# Patient Record
Sex: Male | Born: 1995 | Race: Black or African American | Hispanic: No | Marital: Single | Smoking: Never smoker
Health system: Southern US, Community
[De-identification: ages and names within clinical notes are randomized; demographics above are authoritative.]

## PROBLEM LIST (undated history)

## (undated) DIAGNOSIS — R9431 Abnormal electrocardiogram [ECG] [EKG]: Secondary | ICD-10-CM

## (undated) DIAGNOSIS — I429 Cardiomyopathy, unspecified: Secondary | ICD-10-CM

## (undated) HISTORY — DX: Abnormal electrocardiogram (ECG) (EKG): R94.31

## (undated) HISTORY — DX: Cardiomyopathy, unspecified: I42.9

---
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
## 2017-06-29 ENCOUNTER — Ambulatory Visit (HOSPITAL_COMMUNITY)
Admission: EM | Admit: 2017-06-29 | Discharge: 2017-06-29 | Disposition: A | Discharge status: Home / Self Care | Payer: BC Managed Care – PPO | Source: Home / Self Care | Attending: Family Medicine | Admitting: Family Medicine

## 2017-06-29 ENCOUNTER — Encounter (HOSPITAL_COMMUNITY): Payer: Self-pay | Admitting: Emergency Medicine

## 2017-06-29 ENCOUNTER — Emergency Department (HOSPITAL_BASED_OUTPATIENT_CLINIC_OR_DEPARTMENT_OTHER): Payer: BC Managed Care – PPO

## 2017-06-29 ENCOUNTER — Other Ambulatory Visit: Payer: Self-pay

## 2017-06-29 ENCOUNTER — Emergency Department (HOSPITAL_COMMUNITY)
Admission: EM | Admit: 2017-06-29 | Discharge: 2017-06-29 | Disposition: A | Discharge status: Home / Self Care | Payer: BC Managed Care – PPO | Attending: Emergency Medicine | Admitting: Emergency Medicine

## 2017-06-29 ENCOUNTER — Emergency Department (HOSPITAL_COMMUNITY): Payer: BC Managed Care – PPO

## 2017-06-29 DIAGNOSIS — I300 Acute nonspecific idiopathic pericarditis: Secondary | ICD-10-CM

## 2017-06-29 DIAGNOSIS — R079 Chest pain, unspecified: Secondary | ICD-10-CM

## 2017-06-29 DIAGNOSIS — K219 Gastro-esophageal reflux disease without esophagitis: Secondary | ICD-10-CM

## 2017-06-29 DIAGNOSIS — Z79899 Other long term (current) drug therapy: Secondary | ICD-10-CM | POA: Insufficient documentation

## 2017-06-29 DIAGNOSIS — R9431 Abnormal electrocardiogram [ECG] [EKG]: Secondary | ICD-10-CM | POA: Insufficient documentation

## 2017-06-29 LAB — I-STAT TROPONIN, ED
TROPONIN I, POC: 0 ng/mL (ref 0.00–0.08)
Troponin i, poc: 0.01 ng/mL (ref 0.00–0.08)
Troponin i, poc: 0.01 ng/mL (ref 0.00–0.08)

## 2017-06-29 LAB — CBC
HCT: 44.7 % (ref 39.0–52.0)
Hemoglobin: 13.9 g/dL (ref 13.0–17.0)
MCH: 22.2 pg — ABNORMAL LOW (ref 26.0–34.0)
MCHC: 31.1 g/dL (ref 30.0–36.0)
MCV: 71.4 fL — ABNORMAL LOW (ref 78.0–100.0)
Platelets: 167 10*3/uL (ref 150–400)
RBC: 6.26 MIL/uL — ABNORMAL HIGH (ref 4.22–5.81)
RDW: 17.8 % — ABNORMAL HIGH (ref 11.5–15.5)
WBC: 4.1 10*3/uL (ref 4.0–10.5)

## 2017-06-29 LAB — ECHOCARDIOGRAM LIMITED
Height: 75 in
Weight: 2720 oz

## 2017-06-29 LAB — BASIC METABOLIC PANEL
Anion gap: 8 (ref 5–15)
BUN: 7 mg/dL (ref 6–20)
CO2: 26 mmol/L (ref 22–32)
Calcium: 9.6 mg/dL (ref 8.9–10.3)
Chloride: 104 mmol/L (ref 101–111)
Creatinine, Ser: 1.03 mg/dL (ref 0.61–1.24)
GFR calc non Af Amer: >60 mL/min (ref >60)
Glucose, Bld: 91 mg/dL (ref 65–99)
Potassium: 3.9 mmol/L (ref 3.5–5.1)
Sodium: 138 mmol/L (ref 135–145)

## 2017-06-29 MED ORDER — COLCHICINE 0.6 MG PO TABS: 0.6000 mg | ORAL_TABLET | Freq: Two times a day (BID) | ORAL | 0 refills | Status: AC

## 2017-06-29 MED ORDER — PANTOPRAZOLE SODIUM 20 MG PO TBEC: 40.0000 mg | DELAYED_RELEASE_TABLET | Freq: Every day | ORAL | 0 refills | Status: AC

## 2017-06-29 MED ORDER — ASPIRIN 81 MG PO CHEW
324.0000 mg | CHEWABLE_TABLET | Freq: Once | ORAL | Status: AC
  Administered 2017-06-29: 324 mg via ORAL
  Filled 2017-06-29: qty 4

## 2017-06-29 MED ORDER — LISINOPRIL 2.5 MG PO TABS: 2.5000 mg | ORAL_TABLET | Freq: Every day | ORAL | 0 refills | Status: AC

## 2017-06-29 NOTE — Progress Notes (Signed)
  Echocardiogram 2D Echocardiogram has been performed.  Delcie RochENNINGTON, Chau Sawin 06/29/2017, 6:22 PM

## 2017-06-29 NOTE — ED Provider Notes (Signed)
[facility]   CSN: [id number] Arrival date & time: 06/29/17  1421   History   Chief Complaint Chief Complaint  Patient presents with  . Abnormal ECG    HPI David Jefferson is a 22 y.o. male.  The history is provided by the patient.   22 yo M with no PMHx who presents from urgent care with complaint of chest pain. Sent over after a concerning EKG with STE. States he has had mild sharp left sided chest pain almost daily for the past 6 months, but has had similar pains intermittently for more than 2 years. States pain seems to occur in relation to eating, lasts around 2 minutes at a time, resolves, and then returns. This will happen a few times a day. Not accompanied by dyspnea, but does get intermittently dyspneic when eating food at times. Not relieved by anything. Denies N/V, diarphoesis. No recent fever, cough, URI symptoms. No tobacco use, HTN, HLD. No cardiac history. No immediate family history of cardiac disease at young age.   No past medical history on file.  There are no active problems to display for this patient.   No past surgical history on file.     Home Medications    Prior to Admission medications   Medication Sig Start Date End Date Taking? Authorizing Provider  colchicine 0.6 MG tablet Take 1 tablet (0.6 mg total) by mouth 2 (two) times daily for 7 days. 06/29/17 07/06/17  Diannia Ruderucker, Tehani Mersman, MD  lansoprazole (PREVACID) 15 MG capsule Take 1 capsule (15 mg total) by mouth daily at 12 noon. Patient not taking: Reported on 06/29/2017 07/27/13   Marcellina MillinGaley, Timothy, MD  lisinopril (ZESTRIL) 2.5 MG tablet Take 1 tablet (2.5 mg total) by mouth daily. 06/29/17 07/29/17  Diannia Ruderucker, Askia Hazelip, MD  methocarbamol (ROBAXIN) 500 MG tablet Take 1 tablet (500 mg total) by mouth 2 (two) times daily. Patient not taking: Reported on 06/29/2017 10/30/14   Santiago GladLaisure, Heather, PA-C  naproxen (NAPROSYN) 500 MG tablet Take 1 tablet (500 mg total) by mouth 2  (two) times daily. Patient not taking: Reported on 06/29/2017 10/30/14   Santiago GladLaisure, Heather, PA-C  pantoprazole (PROTONIX) 20 MG tablet Take 2 tablets (40 mg total) by mouth daily. 06/29/17 07/29/17  Diannia Ruderucker, Lyrick Lagrand, MD    Family History Family History  Problem Relation Age of Onset  . Healthy Mother   . Diabetes Father     Social History Social History   Tobacco Use  . Smoking status: Never Smoker  . Smokeless tobacco: Never Used  Substance Use Topics  . Alcohol use: No  . Drug use: No     Allergies   Patient has no known allergies.   Review of Systems Review of Systems  Constitutional: Negative for chills and fever.  HENT: Negative for ear pain and sore throat.   Eyes: Negative for pain and visual disturbance.  Respiratory: Positive for shortness of breath. Negative for cough.   Cardiovascular: Positive for chest pain. Negative for palpitations.  Gastrointestinal: Negative for abdominal pain and vomiting.  Genitourinary: Negative for dysuria and hematuria.  Musculoskeletal: Negative for arthralgias and back pain.  Skin: Negative for color change and rash.  Neurological: Negative for seizures and syncope.  All other systems reviewed and are negative.    Physical Exam Updated Vital Signs BP (!) 107/55   Pulse 61   Temp 98.4 F (36.9 C) (Oral)   Resp 15   Ht 6\' 3"  (1.905 m)   Wt  77.1 kg (170 lb)   SpO2 96%   BMI 21.25 kg/m   Physical Exam  Constitutional: He is oriented to person, place, and time. He appears well-developed and well-nourished.  HENT:  Head: Normocephalic and atraumatic.  Eyes: Conjunctivae are normal.  Neck: Neck supple.  Cardiovascular: Normal rate, regular rhythm, normal heart sounds and intact distal pulses. Exam reveals no friction rub.  No murmur heard. Pulmonary/Chest: Effort normal and breath sounds normal. No stridor. No respiratory distress. He has no wheezes. He exhibits no tenderness.  Abdominal: Soft. He exhibits no distension.  There is no tenderness. There is no guarding.  Musculoskeletal: Normal range of motion. He exhibits no edema.  Neurological: He is alert and oriented to person, place, and time.  Skin: Skin is warm and dry.  Psychiatric: He has a normal mood and affect.  Nursing note and vitals reviewed.    ED Treatments / Results  Labs (all labs ordered are listed, but only abnormal results are displayed) Labs Reviewed  CBC - Abnormal; Notable for the following components:      Result Value   RBC 6.26 (*)    MCV 71.4 (*)    MCH 22.2 (*)    RDW 17.8 (*)    All other components within normal limits  BASIC METABOLIC PANEL  B. BURGDORFI ANTIBODIES  CBG MONITORING, ED  I-STAT TROPONIN, ED  I-STAT TROPONIN, ED  I-STAT TROPONIN, ED  I-STAT TROPONIN, ED    EKG EKG Interpretation  Date/Time:  Monday June 29 2017 14:54:13 EDT Ventricular Rate:  83 PR Interval:  162 QRS Duration: 92 QT Interval:  328 QTC Calculation: 385 R Axis:   88 Text Interpretation:  Normal sinus rhythm ST elevation consider inferior injury or acute infarct  - new ST elevation consider anterior injury or acute infarct  - more pronounced Abnormal ECG Confirmed by Derwood Kaplan [id number]) on 06/29/2017 3:30:56 PM   Radiology Dg Chest 2 View  Result Date: 06/29/2017 CLINICAL DATA:  Chest pain EXAM: CHEST - 2 VIEW COMPARISON:  12/19/2015 FINDINGS: The heart size and mediastinal contours are within normal limits. Both lungs are clear. The visualized skeletal structures are unremarkable. IMPRESSION: No active cardiopulmonary disease. Electronically Signed   By: Elige Ko   On: 06/29/2017 15:47    Procedures Procedures (including critical care time)  Medications Ordered in ED Medications  aspirin chewable tablet 324 mg (324 mg Oral Given 06/29/17 1618)     Initial Impression / Assessment and Plan / ED Course  I have reviewed the triage vital signs and the nursing notes.  Pertinent labs & imaging results that were  available during my care of the patient were reviewed by me and considered in my medical decision making (see chart for details).     David Jefferson is a 22 y.o. male with no significant PMHx who presents with interimttent chest pain over last couple years. Sent from urgent care with EKG with STE. Reviewed and confirmed nursing documentation for past medical history, family history, social history. VS afebrile, wnl. Exam remarkable for unremarkable. Ddx includes likely early repolarization. Given chronicity of symptoms, unlikely pericarditis. History not c/w ACS.   EKG with STE in anteriors leads c/w early repolarization, there is more pronounced STE in inferior leads when compared to EKG 11/2015. ASA given. Discussed with cardiology, Dr.Nelson, who agrees that EKG likely early repol, but recommends delta troponins and stat 2D echo. Trop x 2 negative, BMP wnl. CBC wnl. CXR neg.   2D echo with  mildly diffusely decreased LVEF estimated at 50-55%, no effusion, RV appears mildly dilated. Dr.Nelson evaluated pt in department. Recommended outpt treatment as pericarditis if 3rd trop neg, as well as protonix and low dose lisinopril, f/u in 2 wks. Trop x 3 negative. Lyme antibodies drawn, cardiology to f/u. On reexam, pt resting comfortably, pain free.   Old records reviewed. Labs reviewed by me and used in the medical decision making.  Imaging viewed and interpreted by me and used in the medical decision making (formal interpretation from radiologist). EKG reviewed by me and used in the medical decision making. D/c home in stable condition, return precautions discussed. Patient agreeable with plan for d/c home.  Final Clinical Impressions(s) / ED Diagnoses   Final diagnoses:  Abnormal EKG  Chest pain, unspecified type    ED Discharge Orders        Ordered    lisinopril (ZESTRIL) 2.5 MG tablet  Daily     06/29/17 2041    colchicine 0.6 MG tablet  2 times daily     06/29/17 2041    pantoprazole  (PROTONIX) 20 MG tablet  Daily     06/29/17 2041       Diannia Ruder, MD 06/29/17 2247    Derwood Kaplan, MD 07/01/17 [phone]

## 2017-06-29 NOTE — ED Notes (Signed)
Second IV deferred by MD.

## 2017-06-29 NOTE — Consult Note (Addendum)
Cardiology Consult:   Jefferson ID: David Jefferson; MRN: [id number]; DOB: August 30, 1995   Consult date: 06/29/2017  Primary Care Provider: Patient, No Pcp Per Primary Cardiologist: Tobias Alexander, MD  - New  Chief Complaint:  Chest pain  [name] is a 22 y.o. male with no known medical history who presented with sharp intermittent chest pain.   History of Present Illness:   David Jefferson presented with complaints of intermittent, sharp left sided chest pain that has been occurring for about David last 2 years. He denied any nausea, vomiting, back pain, jaw or arm pain. He stated that he feels it worsens if he eats greasy foods or pork, he feels his diet can affect David pain. He was seen for chest pain in 2017 and ws discharged home after GI cocktail.   David Jefferson says that he is very active and plays basketball regularly. He works at [employer].   Troponins are negative. EKG showed Normal sinus rhythm with sinus arrhythmia, LVH and diffuse ST elevation in leads II, III, AVF, V3-V6. consider early repolarization, pericarditis, or injury. CXR shows no active cardiopulmonary disease.   No past medical history on file.  No past surgical history on file.   Medications Prior to Admission: Prior to Admission medications   Medication Sig Start Date End Date Taking? Authorizing Provider  lansoprazole (PREVACID) 15 MG capsule Take 1 capsule (15 mg total) by mouth daily at 12 noon. Jefferson not taking: Reported on 06/29/2017 07/27/13   Marcellina Millin, MD  methocarbamol (ROBAXIN) 500 MG tablet Take 1 tablet (500 mg total) by mouth 2 (two) times daily. Jefferson not taking: Reported on 06/29/2017 10/30/14   Santiago Glad, PA-C  naproxen (NAPROSYN) 500 MG tablet Take 1 tablet (500 mg total) by mouth 2 (two) times daily. Jefferson not taking: Reported on 06/29/2017 10/30/14   Santiago Glad, PA-C    Allergies:   No Known Allergies  Social History:   Social History   Socioeconomic  History  . Marital status: Single    Spouse name: Not on file  . Number of children: Not on file  . Years of education: Not on file  . Highest education level: Not on file  Occupational History  . Not on file  Social Needs  . Financial resource strain: Not on file  . Food insecurity:    Worry: Not on file    Inability: Not on file  . Transportation needs:    Medical: Not on file    Non-medical: Not on file  Tobacco Use  . Smoking status: Never Smoker  . Smokeless tobacco: Never Used  Substance and Sexual Activity  . Alcohol use: No  . Drug use: No  . Sexual activity: Never  Lifestyle  . Physical activity:    Days per week: Not on file    Minutes per session: Not on file  . Stress: Not on file  Relationships  . Social connections:    Talks on phone: Not on file    Gets together: Not on file    Attends religious service: Not on file    Active member of club or organization: Not on file    Attends meetings of clubs or organizations: Not on file    Relationship status: Not on file  . Intimate partner violence:    Fear of current or ex partner: Not on file    Emotionally abused: Not on file    Physically abused: Not on file    Forced  sexual activity: Not on file  Other Topics Concern  . Not on file  Social History Narrative  . Not on file    Family History:   David Jefferson's family history includes Diabetes in his father; Healthy in his mother.  Grandfather had heart disease and received a pacemaker at older age.  ROS:  Please see David history of present illness.  All other ROS reviewed and negative.     Physical Exam/Data:   Vitals:   06/29/17 1730 06/29/17 1745 06/29/17 1800 06/29/17 1815  BP: 115/64 118/75 117/67 117/77  Pulse: 60 71 65 63  Resp: 18 18 (!) 21 (!) 24  Temp:      TempSrc:      SpO2: 100% 99% 100% 100%  Weight:      Height:        Intake/Output Summary (Last 24 hours) at 06/29/2017 1922 Last data filed at 06/29/2017 1817 Gross per 24 hour    Intake -  Output 550 ml  Net -550 ml   Filed Weights   06/29/17 1622  Weight: 170 lb (77.1 kg)   Body mass index is 21.25 kg/m.  General:  Well nourished, well developed, in no acute distress HEENT: normal Lymph: no adenopathy Neck: no JVD Endocrine:  No thryomegaly Vascular: No carotid bruits; FA pulses 2+ bilaterally without bruits  Cardiac:  normal S1, S2; RRR; no murmur  Lungs:  clear to auscultation bilaterally, no wheezing, rhonchi or rales  Abd: soft, nontender, no hepatomegaly  Ext: no edema Musculoskeletal:  No deformities, BUE and BLE strength normal and equal Skin: warm and dry  Neuro:  CNs 2-12 intact, no focal abnormalities noted Psych:  Normal affect    EKG:  David ECG that was done was personally reviewed and demonstrates Normal sinus rhythm with sinus arrhythmia, LVH and diffuse ST elevation in leads II, III, AVF, V3-V6. consider early repolarization, pericarditis, or injury.  Relevant CV Studies:  Echocardiogram 06/29/17 Study Conclusions  - Left ventricle: David cavity size was normal. Systolic function was normal. David estimated ejection fraction was in David range of 50% to 55%. Wall motion was normal; there were no regional wall motion abnormalities. Left ventricular diastolic function parameters were normal. - Aortic valve: There was no regurgitation. - Right ventricle: Systolic function was normal. - Tricuspid valve: There was mild regurgitation. - Pulmonary arteries: Systolic pressure could not be accurately estimated. - Inferior vena cava: David vessel was normal in size. - Pericardium, extracardiac: There was no pericardial effusion.  Impressions: - Mildly diffusely decreased LVEF estimated at 50-55%. No   pericardial effusion. RV appears mildy dilated.  Laboratory Data:  Chemistry Recent Labs  Lab 06/29/17 1459  NA 138  K 3.9  CL 104  CO2 26  GLUCOSE 91  BUN 7  CREATININE 1.03  CALCIUM 9.6  GFRNONAA >60  GFRAA >60  ANIONGAP 8    No  results for input(s): PROT, ALBUMIN, AST, ALT, ALKPHOS, BILITOT in David last 168 hours. Hematology Recent Labs  Lab 06/29/17 1459  WBC 4.1  RBC 6.26*  HGB 13.9  HCT 44.7  MCV 71.4*  MCH 22.2*  MCHC 31.1  RDW 17.8*  PLT 167   Cardiac EnzymesNo results for input(s): TROPONINI in David last 168 hours.  Recent Labs  Lab 06/29/17 1515 06/29/17 1826  TROPIPOC 0.01 0.00    BNPNo results for input(s): BNP, PROBNP in David last 168 hours.  DDimer No results for input(s): DDIMER in David last 168 hours.  Radiology/Studies:  Dg Chest 2 View  Result Date: 06/29/2017 CLINICAL DATA:  Chest pain EXAM: CHEST - 2 VIEW COMPARISON:  12/19/2015 FINDINGS: David heart size and mediastinal contours are within normal limits. Both lungs are clear. David visualized skeletal structures are unremarkable. IMPRESSION: No active cardiopulmonary disease. Electronically Signed   By: Elige KoHetal  Patel   On: 06/29/2017 15:47    Assessment and Plan:   Chest pain -Has pain is intermittent, sharp and has been ongoing for about 2 years.  -EKG with diffuse ST changes -troponins are negative -echocardiogram  Showed EF 50-55%, no pericardial effusion. RV mildly dilated.  -Possibly pericarditis and pt has a lot of GI issues.  -Dr. Delton SeeNelson to see David Jefferson.   For questions or updates, please contact [facility] Please consult [email or web address] for contact info under Cardiology/STEMI.   Signed, Berton BonJanine Hammond, NP  06/29/2017 7:22 PM   David Jefferson was seen, examined and discussed with Berton BonJanine Hammond, NP-C and I agree with David above.   A pleasant 22 year old male with no significant past medical history or cardiac history who has been experiencing on and off daily chest pains for David last 2 years.  David Jefferson describes pain as retrosternal pressure nonradiating, not related to exertion, not associated with any shortness of breath palpitation dizziness or syncope.  He cannot describe if it is worse during David day or at night, it  does not seem to be related to flat position deep inspiration but he states that it almost always after eating food.  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  He presented to urgent care this morning and because of significant early repolarization that is diffuse he was sent for concern of acute myocardial infarction to David ER.  He denies any recent Acute viral illness, tick bite, fever, cough, or diarrhea  On physical exam he looks very comfortable, he is currently chest pain-free, he is well-developed well-nourished oriented x3 and pleasant, he has no JVDs, regular rate and rhythm, no murmurs, his lungs are clear, his peripheral pulses are good and he has no swelling.  His troponin is negative x2.  His EKG shows diffuse concave ST elevations in David inferior lateral and anterior leads, David Jefferson had pattern of early repolarization on his prior EKGs but they were not as pronounced.  Assessment and plan:  David Jefferson presents with very atypical chest pain suspicious for GI related etiology however his EKG is highly suspicious for acute pericarditis, his clinical picture with on and off pain for last 2 years that is not pleuritic not worse when he lays down does not fit that picture.  However I have personally read his EKG and he has diffuse hypokinesis with his LVEF approximately 50%.  David plan would be to start David Jefferson on Protonix 40 mg daily, colchicine 0.6 mg p.o. twice daily to empirically treat for pericarditis, we will also start a very low-dose of lisinopril 2.5 mg daily to address his cardiomyopathy, we will arrange for outpatient follow-up in our clinic in 2 weeks.  Tobias AlexanderKatarina Efe Fazzino, MD 06/29/2017

## 2017-06-29 NOTE — ED Notes (Signed)
Patient transported to X-ray 

## 2017-06-29 NOTE — ED Triage Notes (Signed)
Pt reports chest pain for 6 months that has been constant. Pt went to urgent care and had an EKG that showed ST elevation with early repol.

## 2017-06-29 NOTE — Discharge Instructions (Signed)
Please go to ER for lab testing

## 2017-06-29 NOTE — ED Triage Notes (Signed)
Left chest pain for 6 months.  Pain intermittent, has pain weekly. Patient thinks certain foods aggravates condition.  No nausea, no vomiting.  Had episode of reflux yesterday

## 2017-06-29 NOTE — ED Provider Notes (Signed)
[facility]    CSN: [id number] Arrival date & time: 06/29/17  1201     History   Chief Complaint Chief Complaint  Patient presents with  . Chest Pain    HPI David Jefferson is a 22 y.o. male.   David Jefferson presents with complaints of intermittent left sided chest pain for the past approximately 6 months. He states today it has been coming and going. It is sharp. Denies nausea, vomiting, back pain, jaw or arm pain. He states he feels it worsens if he eats greasy foods or pork, he feels his diet can affect the pain. Yesterday he had some heartburn which he felt was different pain sensation. Lasts approximately 5 minutes at a time. States he does feels somewhat more anxious lately but without any specific trigger. Occasionally feels shortness of breath . No recent travel. No leg pain or swelling. No iv drug use. No family history of early cardiac history. His grandfather has cardiac history. Has not taken any medications for symptoms. No fevers. No weight loss. No palpitations. Was seen for CP in 2017 and was discharged home after a gi cocktail. Without contributing medical history. African [place] male, states he very active, plays basketball regularly.    ROS per HPI.      History reviewed. No pertinent past medical history.  There are no active problems to display for this patient.   History reviewed. No pertinent surgical history.     Home Medications    Prior to Admission medications   Medication Sig Start Date End Date Taking? Authorizing Provider  ibuprofen (ADVIL,MOTRIN) 200 MG tablet Take 200 mg by mouth every 6 (six) hours as needed for mild pain.    [provider]  lansoprazole (PREVACID) 15 MG capsule Take 1 capsule (15 mg total) by mouth daily at 12 noon. 07/27/13   Marcellina Millin, MD  methocarbamol (ROBAXIN) 500 MG tablet Take 1 tablet (500 mg total) by mouth 2 (two) times daily. 10/30/14   Santiago Glad, PA-C  naproxen (NAPROSYN) 500 MG  tablet Take 1 tablet (500 mg total) by mouth 2 (two) times daily. 10/30/14   Santiago Glad, PA-C    Family History Family History  Problem Relation Age of Onset  . Healthy Mother   . Diabetes Father     Social History Social History   Tobacco Use  . Smoking status: Never Smoker  . Smokeless tobacco: Never Used  Substance Use Topics  . Alcohol use: No  . Drug use: No     Allergies   Patient has no known allergies.   Review of Systems Review of Systems   Physical Exam Triage Vital Signs ED Triage Vitals  Enc Vitals Group     BP 06/29/17 1259 138/72     Pulse Rate 06/29/17 1259 (!) 56     Resp 06/29/17 1259 18     Temp 06/29/17 1259 98 F (36.7 C)     Temp Source 06/29/17 1259 Oral     SpO2 06/29/17 1259 100 %     Weight --      Height --      Head Circumference --      Peak Flow --      Pain Score 06/29/17 1256 3     Pain Loc --      Pain Edu? --      Excl. in GC? --    No data found.  Updated Vital Signs BP 138/72 (BP Location: Left Arm)   Pulse David Jefferson Kitchen)  56   Temp 98 F (36.7 C) (Oral)   Resp 18   SpO2 100%    Physical Exam  Constitutional: He is oriented to person, place, and time. He appears well-developed and well-nourished.  Eyes: Pupils are equal, round, and reactive to light.  Neck: Normal range of motion.  Cardiovascular: Regular rhythm and normal pulses. Bradycardia present.  Pulmonary/Chest: Effort normal and breath sounds normal.  Abdominal: Soft. There is no tenderness.  Neurological: He is alert and oriented to person, place, and time.  Skin: Skin is warm and dry.    EKG with stelevation noted, rate of 69.   UC Treatments / Results  Labs (all labs ordered are listed, but only abnormal results are displayed) Labs Reviewed - No data to display  EKG None  Radiology No results found.  Procedures Procedures (including critical care time)  Medications Ordered in UC Medications - No data to display  Initial Impression /  Assessment and Plan / UC Course  I have reviewed the triage vital signs and the nursing notes.  Pertinent labs & imaging results that were available during my care of the patient were reviewed by me and considered in my medical decision making (see chart for details).     Cardiology Attending paged at 1:20 pm CardMaster paged 1:43pm  1402: Spoke with Cardiology Dr. Mayford Knifeurner who does recommend enzymes to be collected for [facility] at this time although ekg does appear to be likely repolarization. Discussed with patient that we do not have these lab capabilities here in UC and recommended go to ER. Patient verbalized understanding and agreeable to plan.  Self tranpsort to ER.   Final Clinical Impressions(s) / UC Diagnoses   Final diagnoses:  Chest pain, unspecified type     Discharge Instructions     Please go to ER for lab testing     ED Prescriptions    None     Controlled Substance Prescriptions [place] Controlled Substance Registry consulted? Not Applicable   Georgetta HaberBurky, Tuwanna Krausz B, NP 06/29/17 [phone]

## 2017-06-29 NOTE — ED Provider Notes (Signed)
Patient placed in Quick Look pathway, seen and evaluated   Chief Complaint: chest pain  HPI:   Everlean AlstromMaurice presents with complaints of intermittent left sided chest pain for the past approximately 6 months. He states today it has been coming and going. It is sharp. Denies nausea, vomiting, back pain, jaw or arm pain. He states he feels it worsens if he eats greasy foods or pork, he feels his diet can affect the pain. Yesterday he had some heartburn which he felt was different pain sensation. Lasts approximately 5 minutes at a time. States he does feels somewhat more anxious lately but without any specific trigger. Occasionally feels shortness of breath . No recent travel. No leg pain or swelling. No iv drug use. No family history of early cardiac history. His grandfather has cardiac history. Has not taken any medications for symptoms. No fevers. No weight loss. No palpitations. Was seen for CP in 2017 and was discharged home after a gi cocktail. Without contributing medical history. African [place] male, states he very active, plays basketball regularly. Patient works at [employer].    ROS: Heart: chest pain  Resp: shortness of breath  Physical Exam:  BP 122/68 (BP Location: Left Arm)   Pulse 78   Temp 98.4 F (36.9 C) (Oral)   Resp 16   SpO2 100%   Gen: No distress  Neuro: Awake and Alert  Skin: Warm and dry  Heart: regular rate and rhythm  Lungs: clear     Initiation of care has begun. The patient has been counseled on the process, plan, and necessity for staying for the completion/evaluation, and the remainder of the medical screening examination     Janne Napoleoneese, Hope M, NP 06/29/17 1502    Pricilla LovelessGoldston, Scott, MD 06/30/17 1034

## 2017-06-29 NOTE — ED Notes (Signed)
Code STEMI activated via [facility] w/ [facility].

## 2017-07-14 ENCOUNTER — Encounter: Payer: Self-pay | Admitting: Physician Assistant

## 2017-07-14 NOTE — Progress Notes (Deleted)
Cardiology Office Note    Date:  07/14/2017  ID:  David Jefferson, DOB Jun 12, 1995, MRN [id number] PCP:  Patient, No Pcp Per  Cardiologist:  Tobias Alexander, MD   Chief Complaint: ***  History of Present Illness:  David Jefferson is a 22 y.o. male with history of abnormal EKG and possible cardiomyopathy who is here to follow up chest pain.  He was seen for chest pain in 2017 and ws discharged home after GI cocktail. He recently presented to Urgent Care 06/29/17 with intermittent, sharp left sided chest pain that has been occurring for about the last 2 years. He denied any nausea, vomiting, back pain, jaw or arm pain. He stated that he felt it would if he ate greasy foods or pork. EKG was abnormal so he was sent to the ER for rule-out. This showed normal sinus rhythm with sinus arrhythmia, LVH and diffuse ST elevation in leads II, III, AVF, V3-V6. consider early repolarization, pericarditis, or injury. Troponins were negative. He is very active and plays basketball regularly. He works at [employer]. CXR nonacute. Echo 06/29/17 showed EF 50-55%, no RWMA per report (diffuse HK by Dr. Lindaann Slough read), RV appeared mildly dilated, mild TR. Dr. Delton See recommended Protonix 40mg  daily, colchicine 0.6mg  BID, and lisinopril 2.5mg  daily to address low normal LVEF. Labs otherwise showed Hgb 13.9 with decreased MCV and normal BMET.   bmet colchicine  Atypical chest pain Cardiomyopathy Abnormal EKG   Past Medical History:  Diagnosis Date  . Abnormal EKG   . Cardiomyopathy (HCC)    a. low normal EF 50-55% by echo 06/2017.    No past surgical history on file.  Current Medications: No outpatient medications have been marked as taking for the 07/15/17 encounter (Appointment) with Laurann Montana, PA-C.   ***   Allergies:   Patient has no known allergies.   Social History   Socioeconomic History  . Marital status: Single    Spouse name: Not on file  . Number of children: Not on file  . Years of  education: Not on file  . Highest education level: Not on file  Occupational History  . Not on file  Social Needs  . Financial resource strain: Not on file  . Food insecurity:    Worry: Not on file    Inability: Not on file  . Transportation needs:    Medical: Not on file    Non-medical: Not on file  Tobacco Use  . Smoking status: Never Smoker  . Smokeless tobacco: Never Used  Substance and Sexual Activity  . Alcohol use: No  . Drug use: No  . Sexual activity: Never  Lifestyle  . Physical activity:    Days per week: Not on file    Minutes per session: Not on file  . Stress: Not on file  Relationships  . Social connections:    Talks on phone: Not on file    Gets together: Not on file    Attends religious service: Not on file    Active member of club or organization: Not on file    Attends meetings of clubs or organizations: Not on file    Relationship status: Not on file  Other Topics Concern  . Not on file  Social History Narrative  . Not on file     Family History:  The patient's ***family history includes Diabetes in his father; Healthy in his mother.  ROS:   Please see the history of present illness. Otherwise, review of systems is  positive for ***.  All other systems are reviewed and otherwise negative.    PHYSICAL EXAM:   VS:  There were no vitals taken for this visit.  BMI: There is no height or weight on file to calculate BMI. GEN: Well nourished, well developed, in no acute distress HEENT: normocephalic, atraumatic Neck: no JVD, carotid bruits, or masses Cardiac: ***RRR; no murmurs, rubs, or gallops, no edema  Respiratory:  clear to auscultation bilaterally, normal work of breathing GI: soft, nontender, nondistended, + BS MS: no deformity or atrophy Skin: warm and dry, no rash Neuro:  Alert and Oriented x 3, Strength and sensation are intact, follows commands Psych: euthymic mood, full affect  Wt Readings from Last 3 Encounters:  06/29/17 170 lb  (77.1 kg)  10/30/14 170 lb (77.1 kg) (76 %, Z= 0.72)*  07/27/13 152 lb 3.2 oz (69 kg) (63 %, Z= 0.34)*   * Growth percentiles are based on CDC (Boys, 2-20 Years) data.      Studies/Labs Reviewed:   EKG:  EKG was ordered today and personally reviewed by me and demonstrates *** EKG was not ordered today.***  Recent Labs: 06/29/2017: BUN 7; Creatinine, Ser 1.03; Hemoglobin 13.9; Platelets 167; Potassium 3.9; Sodium 138   Lipid Panel No results found for: CHOL, TRIG, HDL, CHOLHDL, VLDL, LDLCALC, LDLDIRECT  Additional studies/ records that were reviewed today include: Summarized above.***    ASSESSMENT & PLAN:   1. ***  Disposition: F/u with ***   Medication Adjustments/Labs and Tests Ordered: Current medicines are reviewed at length with the patient today.  Concerns regarding medicines are outlined above. Medication changes, Labs and Tests ordered today are summarized above and listed in the Patient Instructions accessible in Encounters.   Signed, Laurann Montanaayna N Dunn, PA-C  07/14/2017 1:24 PM    [facility] [address], [place], [place]  [address] Phone: [phone]) [phone]; Fax: [phone]

## 2017-07-15 ENCOUNTER — Ambulatory Visit: Payer: BC Managed Care – PPO | Admitting: Physician Assistant

## 2019-04-15 IMAGING — CR DG CHEST 2V
2 series · 2 of 2 positions shown · non-contrast
Comparison: 12/19/2015

CLINICAL DATA: Chest pain

EXAM:
CHEST - 2 VIEW

[chest pa]
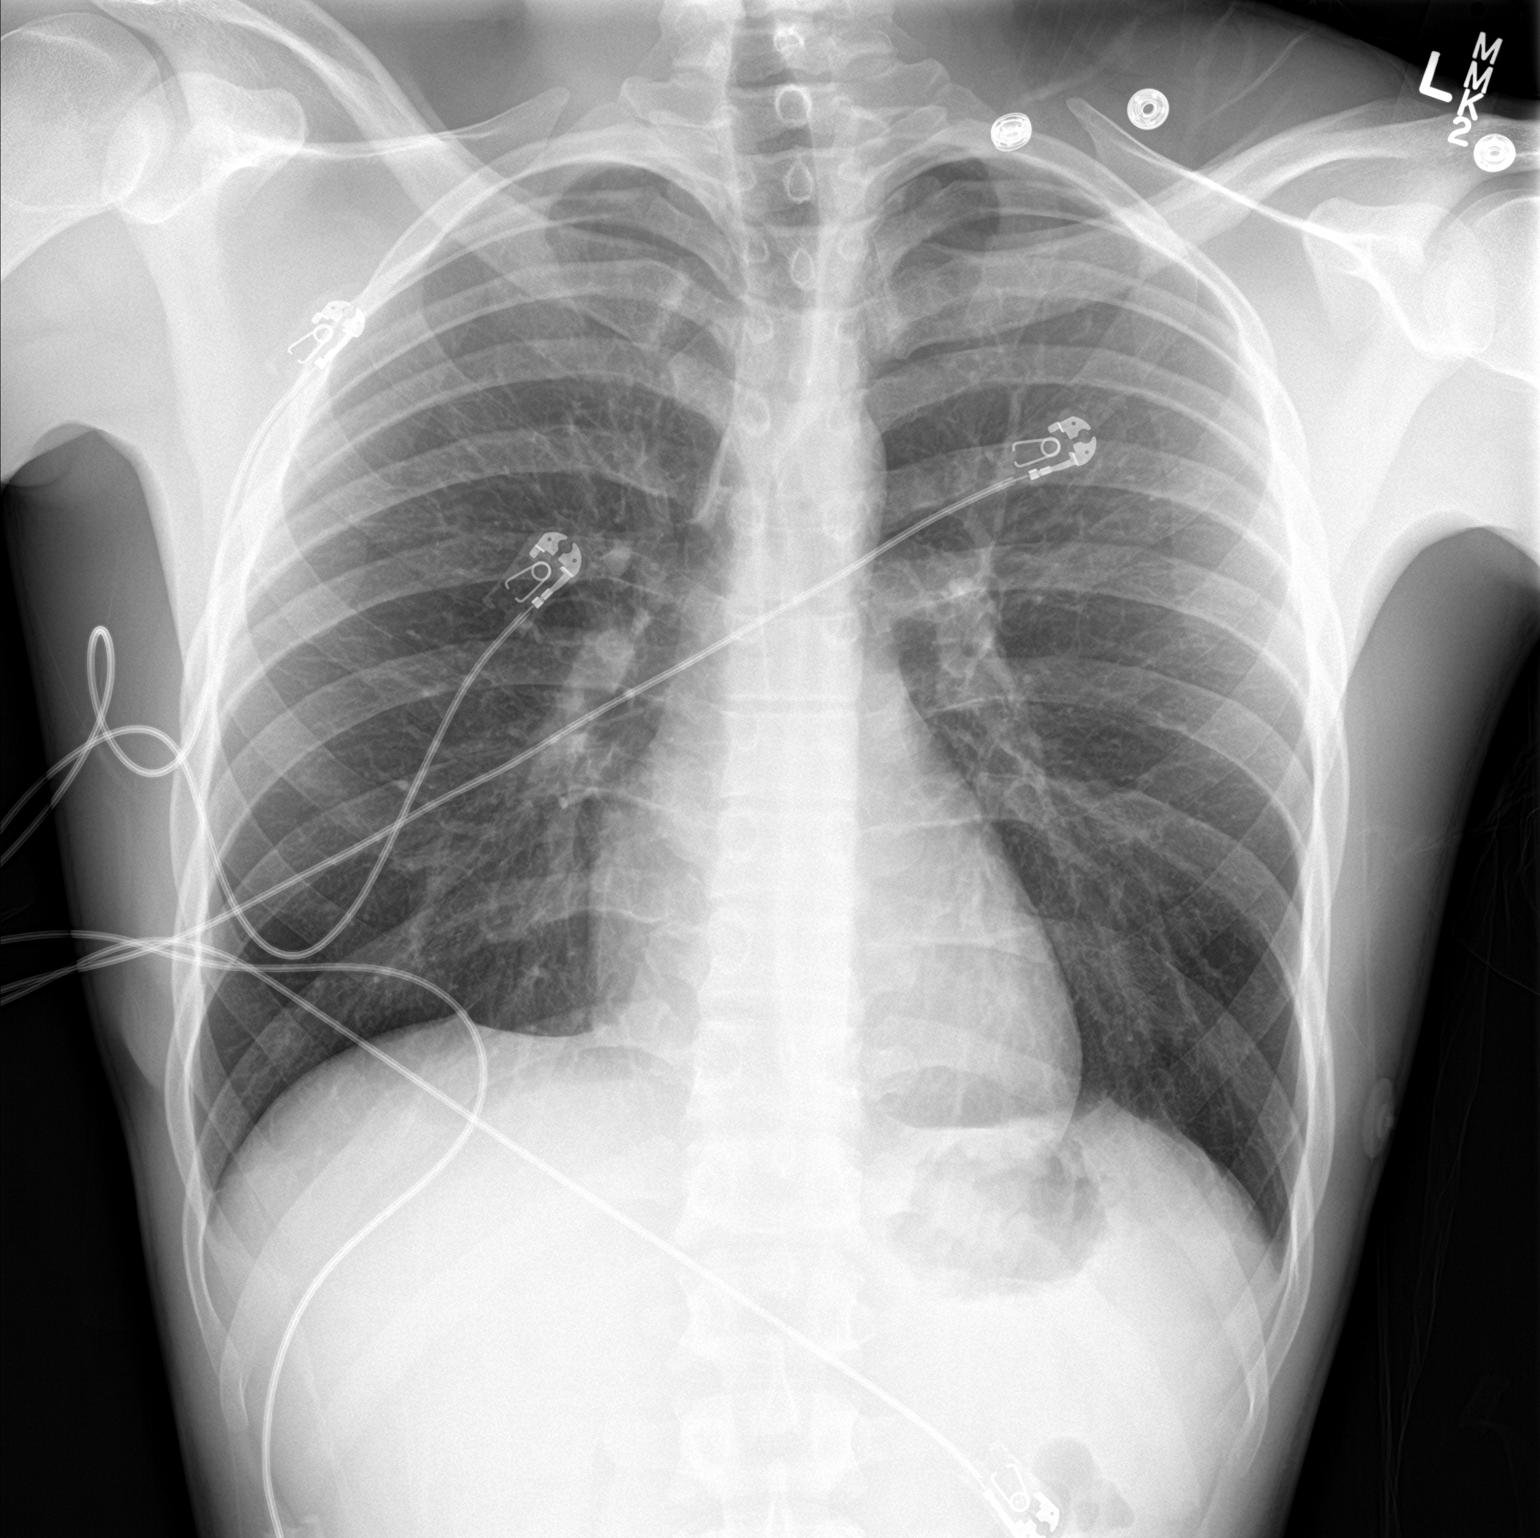

[chest lat]
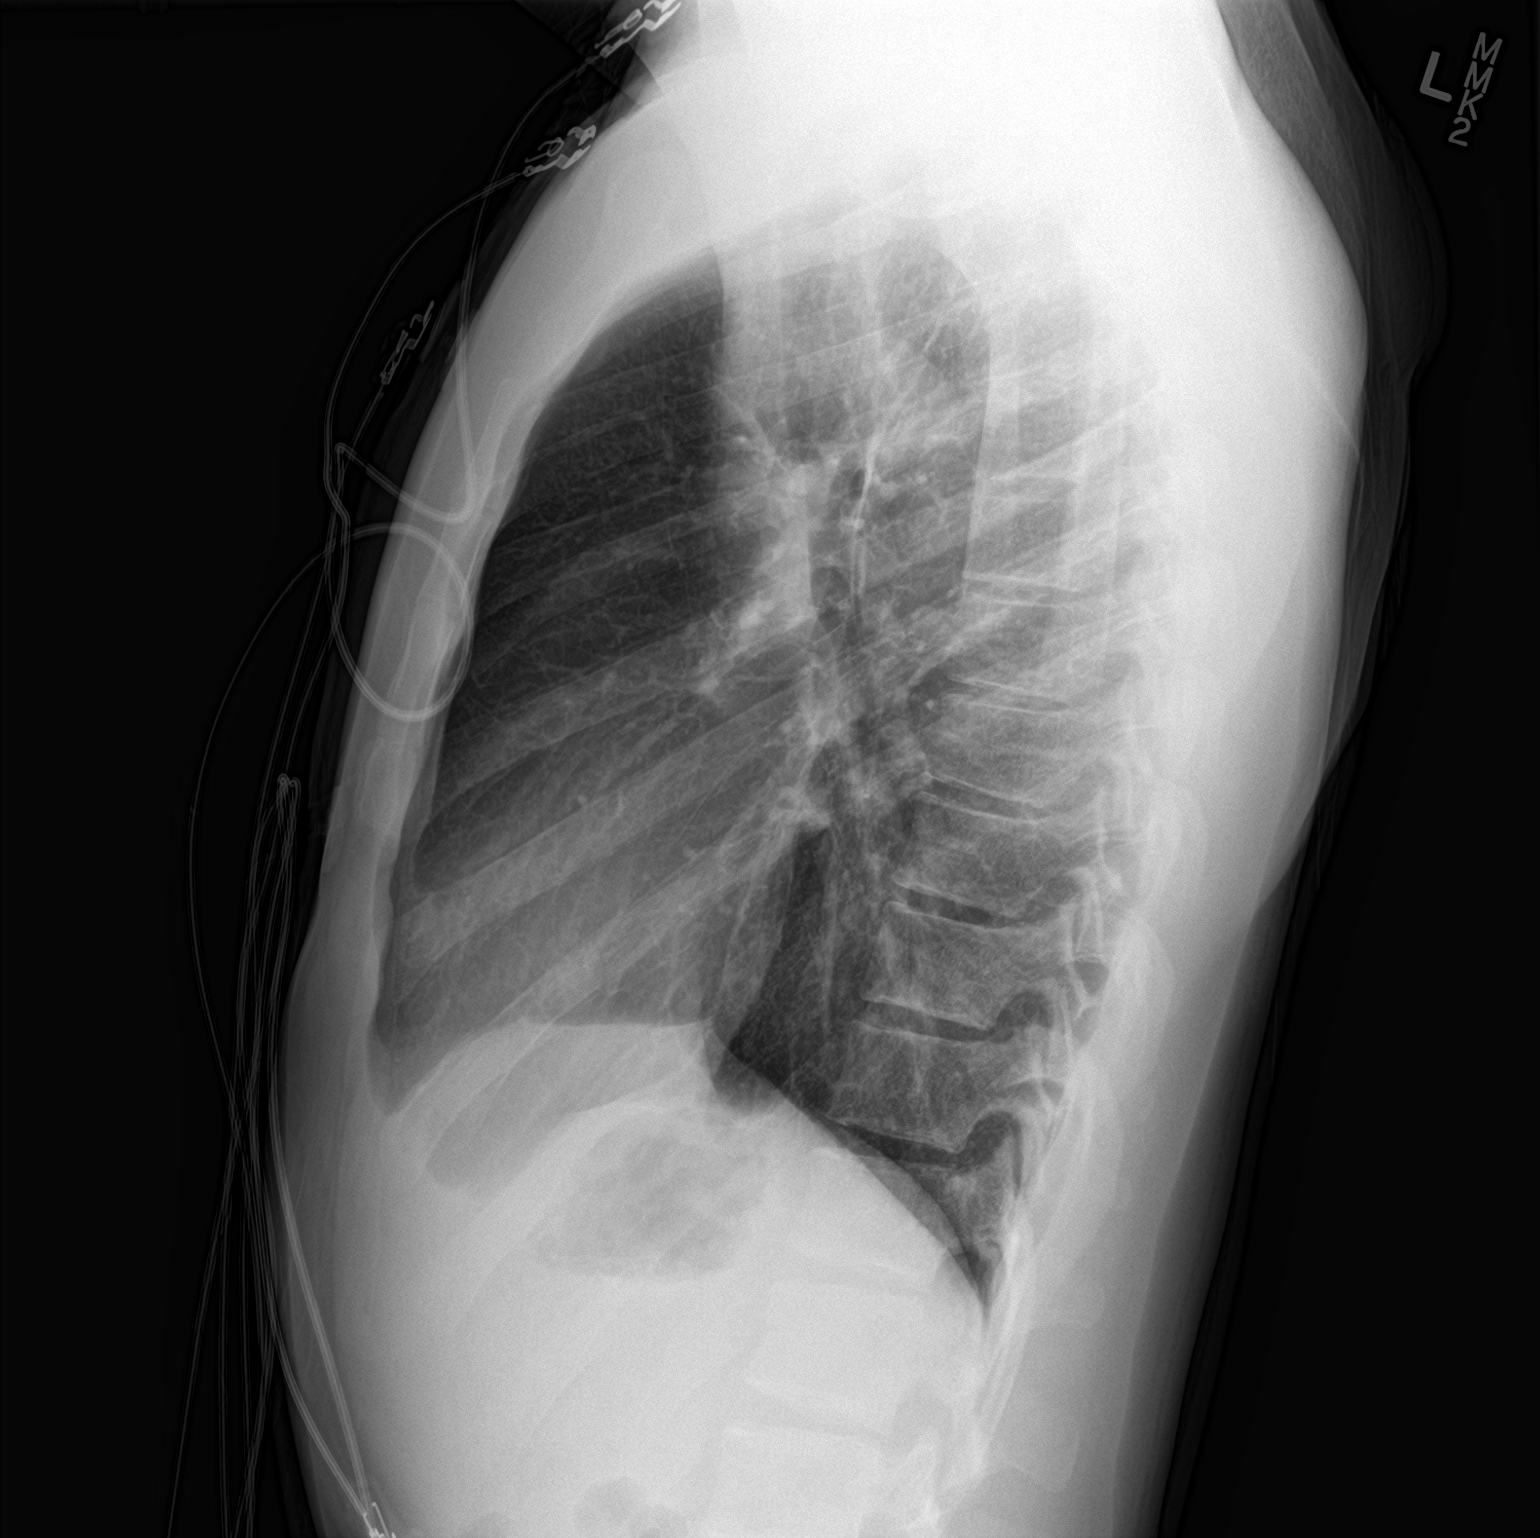

[2 of 2 positions shown; findings below may reference images not displayed]

FINDINGS: The heart size and mediastinal contours are within normal limits.
Both lungs are clear. The visualized skeletal structures are
unremarkable.
IMPRESSION: No active cardiopulmonary disease.

## 2020-05-30 ENCOUNTER — Other Ambulatory Visit: Payer: Self-pay

## 2020-05-30 ENCOUNTER — Ambulatory Visit (HOSPITAL_COMMUNITY)
Admission: EM | Admit: 2020-05-30 | Discharge: 2020-05-30 | Disposition: A | Discharge status: Home / Self Care | Payer: Self-pay | Attending: Family Medicine | Admitting: Family Medicine

## 2020-05-30 ENCOUNTER — Encounter (HOSPITAL_COMMUNITY): Payer: Self-pay | Admitting: *Deleted

## 2020-05-30 DIAGNOSIS — R369 Urethral discharge, unspecified: Secondary | ICD-10-CM | POA: Insufficient documentation

## 2020-05-30 MED ORDER — DOXYCYCLINE HYCLATE 100 MG PO TABS: 100.0000 mg | ORAL_TABLET | Freq: Two times a day (BID) | ORAL | 0 refills | Status: AC

## 2020-05-30 MED ORDER — CEFTRIAXONE SODIUM 500 MG IJ SOLR
500.0000 mg | Freq: Once | INTRAMUSCULAR | Status: AC
  Administered 2020-05-30: 500 mg via INTRAMUSCULAR

## 2020-05-30 MED ORDER — CEFTRIAXONE SODIUM 500 MG IJ SOLR
INTRAMUSCULAR | Status: AC
  Filled 2020-05-30: qty 500

## 2020-05-30 NOTE — ED Provider Notes (Signed)
[facility]    CSN: [id number] Arrival date & time: 05/30/20  0845      History   Chief Complaint Chief Complaint  Patient presents with  . SEXUALLY TRANSMITTED DISEASE  . Dysuria    HPI David Jefferson is a 25 y.o. male.   Patient presenting today with 2-day history of dysuria, green penile discharge.  He denies any pelvic pain, fever, chills, abdominal pain, nausea vomiting diarrhea.  Concerned about STDs today but no known exposures to any.  Denies rashes or lesions.  Not trying anything over-the-counter for symptoms.      Past Medical History:  Diagnosis Date  . Abnormal EKG   . Cardiomyopathy (HCC)    a. low normal EF 50-55% by echo 06/2017.    There are no problems to display for this patient.   History reviewed. No pertinent surgical history.     Home Medications    Prior to Admission medications   Medication Sig Start Date End Date Taking? Authorizing Provider  doxycycline (VIBRA-TABS) 100 MG tablet Take 1 tablet (100 mg total) by mouth 2 (two) times daily. 05/30/20  Yes Particia Nearing, PA-C  colchicine 0.6 MG tablet Take 1 tablet (0.6 mg total) by mouth 2 (two) times daily for 7 days. 06/29/17 07/06/17  Diannia Ruder, MD  lansoprazole (PREVACID) 15 MG capsule Take 1 capsule (15 mg total) by mouth daily at 12 noon. Patient not taking: Reported on 06/29/2017 07/27/13   Marcellina Millin, MD  lisinopril (ZESTRIL) 2.5 MG tablet Take 1 tablet (2.5 mg total) by mouth daily. 06/29/17 07/29/17  Diannia Ruder, MD  methocarbamol (ROBAXIN) 500 MG tablet Take 1 tablet (500 mg total) by mouth 2 (two) times daily. Patient not taking: Reported on 06/29/2017 10/30/14   Santiago Glad, PA-C  naproxen (NAPROSYN) 500 MG tablet Take 1 tablet (500 mg total) by mouth 2 (two) times daily. Patient not taking: Reported on 06/29/2017 10/30/14   Santiago Glad, PA-C  pantoprazole (PROTONIX) 20 MG tablet Take 2 tablets (40 mg total) by mouth daily. 06/29/17 07/29/17  Diannia Ruder, MD    Family History Family History  Problem Relation Age of Onset  . Healthy Mother   . Diabetes Father     Social History Social History   Tobacco Use  . Smoking status: Never Smoker  . Smokeless tobacco: Never Used  Substance Use Topics  . Alcohol use: No  . Drug use: No     Allergies   Patient has no known allergies.   Review of Systems Review of Systems Per HPI Physical Exam Triage Vital Signs ED Triage Vitals  Enc Vitals Group     BP 05/30/20 0902 118/78     Pulse Rate 05/30/20 0902 79     Resp 05/30/20 0902 16     Temp 05/30/20 0902 98.7 F (37.1 C)     Temp Source 05/30/20 0902 Oral     SpO2 05/30/20 0902 100 %     Weight --      Height --      Head Circumference --      Peak Flow --      Pain Score 05/30/20 0900 5     Pain Loc --      Pain Edu? --      Excl. in GC? --    No data found.  Updated Vital Signs BP 118/78   Pulse 79   Temp 98.7 F (37.1 C) (Oral)   Resp 16   SpO2  100%   Visual Acuity Right Eye Distance:   Left Eye Distance:   Bilateral Distance:    Right Eye Near:   Left Eye Near:    Bilateral Near:     Physical Exam Vitals and nursing note reviewed.  Constitutional:      Appearance: Normal appearance.  HENT:     Head: Atraumatic.     Mouth/Throat:     Mouth: Mucous membranes are moist.     Pharynx: Oropharynx is clear.  Eyes:     Extraocular Movements: Extraocular movements intact.     Conjunctiva/sclera: Conjunctivae normal.  Cardiovascular:     Rate and Rhythm: Normal rate and regular rhythm.  Pulmonary:     Effort: Pulmonary effort is normal. No respiratory distress.     Breath sounds: Normal breath sounds. No wheezing or rales.  Abdominal:     General: Bowel sounds are normal. There is no distension.     Palpations: Abdomen is soft.     Tenderness: There is no abdominal tenderness. There is no guarding.  Genitourinary:    Comments: GU exam deferred, self swab performed Musculoskeletal:         General: Normal range of motion.     Cervical back: Normal range of motion and neck supple.  Skin:    General: Skin is warm and dry.  Neurological:     General: No focal deficit present.     Mental Status: He is oriented to person, place, and time.  Psychiatric:        Mood and Affect: Mood normal.        Thought Content: Thought content normal.        Judgment: Judgment normal.     UC Treatments / Results  Labs (all labs ordered are listed, but only abnormal results are displayed) Labs Reviewed  CYTOLOGY, (ORAL, ANAL, URETHRAL) ANCILLARY ONLY    EKG   Radiology No results found.  Procedures Procedures (including critical care time)  Medications Ordered in UC Medications  cefTRIAXone (ROCEPHIN) injection 500 mg (500 mg Intramuscular Given 05/30/20 0943)    Initial Impression / Assessment and Plan / UC Course  I have reviewed the triage vital signs and the nursing notes.  Pertinent labs & imaging results that were available during my care of the patient were reviewed by me and considered in my medical decision making (see chart for details).     Exam and vitals reassuring, cytology swab pending.  Will treat with IM Rocephin and doxycycline while awaiting results.  Discussed abstinence from sexual contact until results return and treatments completed.  Safe sexual practices reviewed.  Final Clinical Impressions(s) / UC Diagnoses   Final diagnoses:  Penile discharge   Discharge Instructions   None    ED Prescriptions    Medication Sig Dispense Auth. Provider   doxycycline (VIBRA-TABS) 100 MG tablet Take 1 tablet (100 mg total) by mouth 2 (two) times daily. 14 tablet Particia Nearing, [place]     PDMP not reviewed this encounter.   Particia Nearing, [place] 05/30/20 1630

## 2020-05-30 NOTE — ED Triage Notes (Signed)
Green DC from penis with dysuria.

## 2020-05-31 LAB — CYTOLOGY, (ORAL, ANAL, URETHRAL) ANCILLARY ONLY
Chlamydia: POSITIVE — AB
Comment: NEGATIVE
Comment: NEGATIVE
Comment: NORMAL
Neisseria Gonorrhea: POSITIVE — AB
Trichomonas: NEGATIVE

## 2021-03-06 ENCOUNTER — Telehealth: Payer: Self-pay | Admitting: Genetics

## 2021-03-06 NOTE — Telephone Encounter (Signed)
David Jefferson was contacted by telephone to review their Cystic Fibrosis (CF) carrier screening results. The results are screen negative for CF. A negative result on carrier screening reduces the likelihood of being a carrier, however, does not entirely rule out the possibility. All questions answered.

## 2021-03-07 ENCOUNTER — Other Ambulatory Visit: Payer: Self-pay

## 2022-03-12 ENCOUNTER — Emergency Department (HOSPITAL_COMMUNITY): Payer: Medicaid Other

## 2022-03-12 ENCOUNTER — Emergency Department (HOSPITAL_COMMUNITY)
Admission: EM | Admit: 2022-03-12 | Discharge: 2022-03-12 | Disposition: A | Discharge status: Home / Self Care | Payer: Medicaid Other | Attending: Emergency Medicine | Admitting: Emergency Medicine

## 2022-03-12 DIAGNOSIS — M25562 Pain in left knee: Secondary | ICD-10-CM | POA: Insufficient documentation

## 2022-03-12 DIAGNOSIS — X500XXA Overexertion from strenuous movement or load, initial encounter: Secondary | ICD-10-CM | POA: Insufficient documentation

## 2022-03-12 DIAGNOSIS — Y990 Civilian activity done for income or pay: Secondary | ICD-10-CM | POA: Diagnosis not present

## 2022-03-12 MED ORDER — IBUPROFEN 400 MG PO TABS
600.0000 mg | ORAL_TABLET | Freq: Once | ORAL | Status: AC
  Administered 2022-03-12: 600 mg via ORAL
  Filled 2022-03-12: qty 1

## 2022-03-12 NOTE — ED Triage Notes (Signed)
Patient here with complaint of left knee pain that started today. Patient was standing on a metal lift-gait when it collapsed suddenly and he fell onto his knee on the metal.

## 2022-03-12 NOTE — ED Provider Triage Note (Signed)
Emergency Medicine Provider Triage Evaluation Note  David Jefferson , a 27 y.o. male  was evaluated in triage.  Pt complains of left knee injury.  Patient reports prior to arrival he was on lift gate, 4 feet off the ground, when lift gate collapsed beneath him.  Patient reports that he struck his left knee and is now complaining of medial left knee pain.  Patient denies striking head during fall.  Patient states he is unsure if he can apply weight to his left leg, he walked into the department.    Review of Systems  Positive:  Negative:   Physical Exam  There were no vitals taken for this visit. Gen:   Awake, no distress   Resp:  Normal effort  MSK:   Moves extremities without difficulty  Other:  Reduced range of motion secondary to pain.  No obvious deformity.  Patient reports pain is medial.  Medical Decision Making  Medically screening exam initiated at 12:14 PM.  Appropriate orders placed.  David Jefferson was informed that the remainder of the evaluation will be completed by another provider, this initial triage assessment does not replace that evaluation, and the importance of remaining in the ED until their evaluation is complete.     Azucena Cecil, PA-C 03/12/22 1215

## 2022-03-12 NOTE — ED Notes (Signed)
Patient verbalizes understanding of discharge instructions. Opportunity for questioning and answers were provided. Pt discharged from ED. 

## 2022-03-12 NOTE — ED Provider Notes (Signed)
[place] Provider Note   CSN: [id number] Arrival date & time: 03/12/22  1206     History  Chief Complaint  Patient presents with   Knee Pain    David Jefferson is a 27 y.o. male who presents emergency department with concerns for left knee injury onset prior to arrival.  Notes that he was at work when he was on a lift gate that was approximately 4 feet off the ground.  The lift gate collapsed beneath him.  Notes that he struck his left knee.  Denies hitting his head, LOC, neck pain, swelling.  The history is provided by the patient. No language interpreter was used.       Home Medications Prior to Admission medications   Medication Sig Start Date End Date Taking? Authorizing Provider  colchicine 0.6 MG tablet Take 1 tablet (0.6 mg total) by mouth 2 (two) times daily for 7 days. 06/29/17 07/06/17  Norm Salt, MD  doxycycline (VIBRA-TABS) 100 MG tablet Take 1 tablet (100 mg total) by mouth 2 (two) times daily. 05/30/20   Volney American, PA-C  lansoprazole (PREVACID) 15 MG capsule Take 1 capsule (15 mg total) by mouth daily at 12 noon. Patient not taking: Reported on 06/29/2017 07/27/13   Isaac Bliss, MD  lisinopril (ZESTRIL) 2.5 MG tablet Take 1 tablet (2.5 mg total) by mouth daily. 06/29/17 07/29/17  Norm Salt, MD  methocarbamol (ROBAXIN) 500 MG tablet Take 1 tablet (500 mg total) by mouth 2 (two) times daily. Patient not taking: Reported on 06/29/2017 10/30/14   Hyman Bible, PA-C  naproxen (NAPROSYN) 500 MG tablet Take 1 tablet (500 mg total) by mouth 2 (two) times daily. Patient not taking: Reported on 06/29/2017 10/30/14   Hyman Bible, PA-C  pantoprazole (PROTONIX) 20 MG tablet Take 2 tablets (40 mg total) by mouth daily. 06/29/17 07/29/17  Norm Salt, MD      Allergies    Patient has no known allergies.    Review of Systems   Review of Systems  All other systems reviewed and are negative.   Physical  Exam Updated Vital Signs BP 128/77   Pulse 85   Temp 98.1 F (36.7 C) (Oral)   Resp 16   SpO2 97%  Physical Exam Vitals and nursing note reviewed.  Constitutional:      General: He is not in acute distress.    Appearance: Normal appearance.  Eyes:     General: No scleral icterus.    Extraocular Movements: Extraocular movements intact.  Cardiovascular:     Rate and Rhythm: Normal rate.  Pulmonary:     Effort: Pulmonary effort is normal. No respiratory distress.  Musculoskeletal:     Cervical back: Neck supple.     Comments: Tenderness to palpation to lateral and medial aspect of the left knee. No obvious deformity, effusion, erythema, or swelling.  Full active range of motion of left knee against resistance.  Patient able to ambulate without difficulty or assistance.  Pedal pulse intact.    Skin:    General: Skin is warm and dry.     Findings: No bruising, erythema or rash.  Neurological:     Mental Status: He is alert.  Psychiatric:        Behavior: Behavior normal.     ED Results / Procedures / Treatments   Labs (all labs ordered are listed, but only abnormal results are displayed) Labs Reviewed - No data to display  EKG None  Radiology  DG Knee Complete 4 Views Left  Result Date: 03/12/2022 CLINICAL DATA:  Pain after fall EXAM: LEFT KNEE - COMPLETE 4 VIEW COMPARISON:  None Available. FINDINGS: No evidence of fracture, dislocation, or joint effusion. No evidence of arthropathy or other focal bone abnormality. Soft tissues are unremarkable. IMPRESSION: No acute osseous abnormality Electronically Signed   By: Jill Side M.D.   On: 03/12/2022 12:56    Procedures Procedures    Medications Ordered in ED Medications  ibuprofen (ADVIL) tablet 600 mg (600 mg Oral Given 03/12/22 1406)    ED Course/ Medical Decision Making/ A&P                             Medical Decision Making  Patient with left knee pain onset prior to arrival. Vital signs pt afebrile. On exam,  patient with tenderness to palpation to lateral and medial aspect of the left knee. No obvious deformity, effusion, erythema, or swelling.  Full active range of motion of left knee against resistance.  Patient able to ambulate without difficulty or assistance.  Pedal pulse intact.  Differential diagnosis includes fracture, dislocation, sprain, effusion.   Imaging: I ordered imaging studies including left knee xray I independently visualized and interpreted imaging which showed: no acute findings I agree with the radiologist interpretation  Medications:  I ordered medication including ibuprofen for symptom management  Reevaluation of the patient after these medicines and interventions, I reevaluated the patient and found that they have improved I have reviewed the patients home medicines and have made adjustments as needed   Disposition: Pt presentation suspicious for acute pain of left knee. Doubt fracture, dislocation, contusion, sprain at this time.  Doubt concerns for effusion at this time. After consideration of the diagnostic results and the patients response to treatment, I feel that the patient would benefit from Discharge home.  Patient provided with information for sports medicine to follow-up as needed.  Patient provided with Ace wrap today. Supportive care measures and strict return precautions discussed with patient at bedside. Pt acknowledges and verbalizes understanding. Pt appears safe for discharge. Follow up as indicated in discharge paperwork.    This chart was dictated using voice recognition software, Dragon. Despite the best efforts of this provider to proofread and correct errors, errors may still occur which can change documentation meaning.   Final Clinical Impression(s) / ED Diagnoses Final diagnoses:  Acute pain of left knee    Rx / DC Orders ED Discharge Orders     None         Kimori Tartaglia A, PA-C 03/12/22 1428    Hayden Rasmussen, MD 03/12/22  [phone]

## 2022-03-12 NOTE — Discharge Instructions (Addendum)
It was a pleasure taking care of you!   Your x-ray was negative for fracture or dislocation.  Attached is information for the on-call sports medicine doctor to follow-up as needed.  You may take over the counter 600 mg Ibuprofen every 6 hours and alternate with 500 mg Tylenol every 6 hours as needed for pain for no more than 7 days.  You will be given an Ace wrap today, you may wear it during the day and remove it at night.  Ensure that you are elevating your leg at night.  You may apply ice to affected area for up to 15 minutes at a time. Ensure to place a barrier between your skin and the ice.  You may follow-up with your primary care provider as needed.  Return to the Emergency Department if you are experiencing increasing/worsening symptoms.

## 2023-10-25 ENCOUNTER — Other Ambulatory Visit: Payer: Self-pay

## 2023-10-25 ENCOUNTER — Encounter (HOSPITAL_COMMUNITY): Payer: Self-pay | Admitting: *Deleted

## 2023-10-25 ENCOUNTER — Emergency Department (HOSPITAL_COMMUNITY)
Admission: EM | Admit: 2023-10-25 | Discharge: 2023-10-26 | Disposition: A | Payer: Worker's Compensation | Attending: Emergency Medicine | Admitting: Emergency Medicine

## 2023-10-25 DIAGNOSIS — M5410 Radiculopathy, site unspecified: Secondary | ICD-10-CM | POA: Diagnosis not present

## 2023-10-25 DIAGNOSIS — M545 Low back pain, unspecified: Secondary | ICD-10-CM | POA: Diagnosis present

## 2023-10-25 DIAGNOSIS — Z5321 Procedure and treatment not carried out due to patient leaving prior to being seen by health care provider: Secondary | ICD-10-CM | POA: Diagnosis not present

## 2023-10-25 DIAGNOSIS — Z0283 Encounter for blood-alcohol and blood-drug test: Secondary | ICD-10-CM | POA: Diagnosis present

## 2023-10-25 NOTE — ED Triage Notes (Signed)
 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

## 2023-10-26 ENCOUNTER — Emergency Department (HOSPITAL_COMMUNITY)

## 2023-10-26 ENCOUNTER — Emergency Department (HOSPITAL_COMMUNITY)
Admission: EM | Admit: 2023-10-26 | Discharge: 2023-10-26 | Discharge status: Against Medical Advice | Payer: Worker's Compensation | Source: Home / Self Care

## 2023-10-26 ENCOUNTER — Encounter (HOSPITAL_COMMUNITY): Payer: Self-pay

## 2023-10-26 ENCOUNTER — Other Ambulatory Visit: Payer: Self-pay

## 2023-10-26 DIAGNOSIS — Z0283 Encounter for blood-alcohol and blood-drug test: Secondary | ICD-10-CM | POA: Insufficient documentation

## 2023-10-26 DIAGNOSIS — Z5321 Procedure and treatment not carried out due to patient leaving prior to being seen by health care provider: Secondary | ICD-10-CM | POA: Insufficient documentation

## 2023-10-26 MED ORDER — OXYCODONE HCL 5 MG PO TABS
5.0000 mg | ORAL_TABLET | Freq: Once | ORAL | Status: AC
  Administered 2023-10-26: 5 mg via ORAL
  Filled 2023-10-26: qty 1

## 2023-10-26 MED ORDER — OXYCODONE HCL 5 MG PO TABS: 5.0000 mg | ORAL_TABLET | ORAL | 0 refills | Status: AC | PRN

## 2023-10-26 MED ORDER — PREDNISONE 20 MG PO TABS
60.0000 mg | ORAL_TABLET | Freq: Once | ORAL | Status: AC
  Administered 2023-10-26: 60 mg via ORAL
  Filled 2023-10-26: qty 3

## 2023-10-26 MED ORDER — NAPROXEN 250 MG PO TABS
500.0000 mg | ORAL_TABLET | Freq: Once | ORAL | Status: AC
  Administered 2023-10-26: 500 mg via ORAL
  Filled 2023-10-26: qty 2

## 2023-10-26 MED ORDER — PREDNISONE 10 MG PO TABS: ORAL_TABLET | ORAL | 0 refills | Status: AC

## 2023-10-26 NOTE — ED Notes (Signed)
 Called pt x2 for room, no response.

## 2023-10-26 NOTE — Discharge Instructions (Addendum)
 You were evaluated in the Emergency Department and after careful evaluation, we did not find any emergent condition requiring admission or further testing in the hospital.  Your exam/testing today is overall reassuring.  Symptoms likely due to a pinched nerve in your lower back.  Take the prednisone steroid medication as prescribed.  Use Tylenol 1000 mg every 4-6 hours for pain.  After you finish the prednisone you could start taking Motrin  or ibuprofen  for pain as well.  Take the oxycodone for more significant pain.  Follow-up with [facility].  Please return to the Emergency Department if you experience any worsening of your condition.   Thank you for allowing us  to be a part of your care.

## 2023-10-26 NOTE — ED Provider Notes (Signed)
 [facility] Emergency Department Provider Note MRN:  [id number]  Arrival date & time: 10/26/23     Chief Complaint   Back Pain   History of Present Illness   David Jefferson is a 28 y.o. year-old male with no pertinent past medical history presenting to the ED with chief complaint of back pain.  Noticed some pain to the lower back while at work.  Was lifting things during the day.  Pain located in the left lumbar/SI area and radiating around the left hip and down into the left leg.  Pain getting worse and worse, trouble maintaining a sitting position due to the pain.  Noticed some numbness first day which is improved but having some continued weakness to the leg today.  No bowel or bladder dysfunction.  Review of Systems  A thorough review of systems was obtained and all systems are negative except as noted in the HPI and PMH.   Patient's Health History    Past Medical History:  Diagnosis Date   Abnormal EKG    Cardiomyopathy (HCC)    a. low normal EF 50-55% by echo 06/2017.    History reviewed. No pertinent surgical history.  Family History  Problem Relation Age of Onset   Healthy Mother    Diabetes Father     Social History   Socioeconomic History   Marital status: Single    Spouse name: Not on file   Number of children: Not on file   Years of education: Not on file   Highest education level: Not on file  Occupational History   Not on file  Tobacco Use   Smoking status: Never   Smokeless tobacco: Never  Substance and Sexual Activity   Alcohol use: No   Drug use: No   Sexual activity: Never  Other Topics Concern   Not on file  Social History Narrative   Not on file   Social Drivers of Health   Financial Resource Strain: Not on file  Food Insecurity: Not on file  Transportation Needs: Not on file  Physical Activity: Not on file  Stress: Not on file  Social Connections: Not on file  Intimate Partner Violence: Not on file      Physical Exam   Vitals:   10/25/23 2007 10/26/23 0112  BP: (!) 144/87 (!) 146/79  Pulse: 81 64  Resp: 18 14  Temp: 98.5 F (36.9 C)   SpO2: 100% 100%    CONSTITUTIONAL: Well-appearing, NAD NEURO/PSYCH:  Alert and oriented x 3, no focal deficits EYES:  eyes equal and reactive ENT/NECK:  no LAD, no JVD CARDIO: Regular rate, well-perfused, normal S1 and S2 PULM:  CTAB no wheezing or rhonchi GI/GU:  non-distended, non-tender MSK/SPINE:  No gross deformities, no edema SKIN:  no rash, atraumatic   *Additional and/or pertinent findings included in MDM below  Diagnostic and Interventional Summary    EKG Interpretation Date/Time:    Ventricular Rate:    PR Interval:    QRS Duration:    QT Interval:    QTC Calculation:   R Axis:      Text Interpretation:         Labs Reviewed - No data to display  MR LUMBAR SPINE WO CONTRAST  Final Result      Medications  predniSONE (DELTASONE) tablet 60 mg (has no administration in time range)  oxyCODONE (Oxy IR/ROXICODONE) immediate release tablet 5 mg (5 mg Oral Given 10/26/23 0050)  naproxen  (NAPROSYN ) tablet 500 mg (500 mg  Oral Given 10/26/23 0049)     Procedures  /  Critical Care Procedures  ED Course and Medical Decision Making  Initial Impression and Ddx Suspect radicular back pain however patient does seem to have some weakness with flexion at the hip, weakness with extension at the knee.  This would raise concern for myelopathy.  Obtaining MRI.  Past medical/surgical history that increases complexity of ED encounter: None  Interpretation of Diagnostics I personally reviewed the MRI lumbar spine and my interpretation is as follows: L4 nerve root compression    Patient Reassessment and Ultimate Disposition/Management     MRI confirms suspicion of radicular back pain with nerve root compression.  There is only mild central canal narrowing, no cord involvement.  Patient appropriate for discharge and  follow-up.  Patient management required discussion with the following services or consulting groups:  None  Complexity of Problems Addressed Acute illness or injury that poses threat of life of bodily function  Additional Data Reviewed and Analyzed Further history obtained from: Further history from spouse/family member  Additional Factors Impacting ED Encounter Risk Consideration of hospitalization  [name]. Theadore, MD [facility] Emergency Medicine [facility] [email or web address]  Final Clinical Impressions(s) / ED Diagnoses     ICD-10-CM   1. Radicular low back pain  M54.10       ED Discharge Orders          Ordered    predniSONE (DELTASONE) 10 MG tablet        10/26/23 0244    oxyCODONE (ROXICODONE) 5 MG immediate release tablet  Every 4 hours PRN        10/26/23 0244             Discharge Instructions Discussed with and Provided to Patient:     Discharge Instructions      You were evaluated in the Emergency Department and after careful evaluation, we did not find any emergent condition requiring admission or further testing in the hospital.  Your exam/testing today is overall reassuring.  Symptoms likely due to a pinched nerve in your lower back.  Take the prednisone steroid medication as prescribed.  Use Tylenol 1000 mg every 4-6 hours for pain.  After you finish the prednisone you could start taking Motrin  or ibuprofen  for pain as well.  Take the oxycodone for more significant pain.  Follow-up with [facility].  Please return to the Emergency Department if you experience any worsening of your condition.   Thank you for allowing us  to be a part of your care.       Theadore [name], MD 10/26/23 [phone]

## 2023-10-26 NOTE — ED Triage Notes (Signed)
 Patient needs a drug test for work for his workers comp case that he was here for yesterday. He also wants a doctors note.

## 2023-10-26 NOTE — ED Notes (Signed)
 Awaiting patient from lobby.
